# Patient Record
Sex: Male | Born: 1985 | Race: White | Hispanic: No | Marital: Married | Smoking: Never smoker
Health system: Southern US, Community
[De-identification: ages and names within clinical notes are randomized; demographics above are authoritative.]

## PROBLEM LIST (undated history)

## (undated) DIAGNOSIS — G4730 Sleep apnea, unspecified: Secondary | ICD-10-CM

## (undated) HISTORY — PX: HERNIA REPAIR: SHX51

## (undated) HISTORY — PX: WISDOM TOOTH EXTRACTION: SHX21

---

## 2010-07-02 ENCOUNTER — Emergency Department (HOSPITAL_COMMUNITY)
Admission: EM | Admit: 2010-07-02 | Discharge: 2010-07-03 | Payer: Self-pay | Source: Home / Self Care | Admitting: Emergency Medicine

## 2011-02-13 LAB — POCT I-STAT, CHEM 8
BUN: 13 mg/dL (ref 6–23)
Calcium, Ion: 0.96 mmol/L — ABNORMAL LOW (ref 1.12–1.32)
Creatinine, Ser: 1.6 mg/dL — ABNORMAL HIGH (ref 0.4–1.5)
HCT: 44 % (ref 39.0–52.0)
Sodium: 138 mEq/L (ref 135–145)
TCO2: 20 mmol/L (ref 0–100)

## 2011-02-13 LAB — BASIC METABOLIC PANEL
CO2: 27 mEq/L (ref 19–32)
Chloride: 107 mEq/L (ref 96–112)
GFR calc Af Amer: >60 mL/min (ref >60)
GFR calc non Af Amer: 58 mL/min — ABNORMAL LOW (ref >60)
Glucose, Bld: 102 mg/dL — ABNORMAL HIGH (ref 70–99)
Potassium: 4.2 mEq/L (ref 3.5–5.1)
Sodium: 140 mEq/L (ref 135–145)

## 2011-02-13 LAB — COMPREHENSIVE METABOLIC PANEL
ALT: 22 U/L (ref 0–53)
Albumin: 4.1 g/dL (ref 3.5–5.2)
Chloride: 107 mEq/L (ref 96–112)
Creatinine, Ser: 1.55 mg/dL — ABNORMAL HIGH (ref 0.4–1.5)
GFR calc non Af Amer: 56 mL/min — ABNORMAL LOW (ref >60)
Glucose, Bld: 100 mg/dL — ABNORMAL HIGH (ref 70–99)
Potassium: 3.7 mEq/L (ref 3.5–5.1)

## 2011-02-13 LAB — SAMPLE TO BLOOD BANK

## 2011-02-13 LAB — PROTIME-INR: INR: 1.16 (ref 0.00–1.49)

## 2011-02-13 LAB — CBC
MCH: 30.1 pg (ref 26.0–34.0)
MCHC: 34.5 g/dL (ref 30.0–36.0)
Platelets: 217 10*3/uL (ref 150–400)
WBC: 5.7 10*3/uL (ref 4.0–10.5)

## 2011-02-13 LAB — URINALYSIS, ROUTINE W REFLEX MICROSCOPIC: Nitrite: NEGATIVE

## 2011-02-13 LAB — APTT: aPTT: 24 seconds (ref 24–37)

## 2011-02-13 LAB — ETHANOL: Alcohol, Ethyl (B): 48 mg/dL — ABNORMAL HIGH (ref 0–10)
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
## 2019-09-22 ENCOUNTER — Other Ambulatory Visit: Payer: Self-pay

## 2019-09-22 DIAGNOSIS — Z20822 Contact with and (suspected) exposure to covid-19: Secondary | ICD-10-CM

## 2019-09-23 LAB — NOVEL CORONAVIRUS, NAA: SARS-CoV-2, NAA: NOT DETECTED

## 2019-11-21 ENCOUNTER — Other Ambulatory Visit: Payer: Self-pay

## 2020-01-25 ENCOUNTER — Encounter: Payer: Self-pay | Admitting: Otolaryngology

## 2020-01-30 ENCOUNTER — Other Ambulatory Visit: Payer: Self-pay

## 2020-01-30 ENCOUNTER — Other Ambulatory Visit
Admission: RE | Admit: 2020-01-30 | Discharge: 2020-01-30 | Disposition: A | Discharge status: Home / Self Care | Source: Ambulatory Visit | Attending: Otolaryngology | Admitting: Otolaryngology

## 2020-01-30 DIAGNOSIS — Z20822 Contact with and (suspected) exposure to covid-19: Secondary | ICD-10-CM | POA: Diagnosis not present

## 2020-01-30 DIAGNOSIS — Z01812 Encounter for preprocedural laboratory examination: Secondary | ICD-10-CM | POA: Insufficient documentation

## 2020-01-30 LAB — SARS CORONAVIRUS 2 (TAT 6-24 HRS): SARS Coronavirus 2: NEGATIVE

## 2020-02-01 ENCOUNTER — Ambulatory Visit: Admitting: Anesthesiology

## 2020-02-01 ENCOUNTER — Ambulatory Visit
Admission: RE | Admit: 2020-02-01 | Discharge: 2020-02-01 | Disposition: A | Discharge status: Home / Self Care | Attending: Otolaryngology | Admitting: Otolaryngology

## 2020-02-01 ENCOUNTER — Encounter: Payer: Self-pay | Admitting: Otolaryngology

## 2020-02-01 ENCOUNTER — Other Ambulatory Visit: Payer: Self-pay

## 2020-02-01 ENCOUNTER — Encounter
Admission: RE | Disposition: A | Discharge status: Home / Self Care | Payer: Self-pay | Source: Home / Self Care | Attending: Otolaryngology

## 2020-02-01 DIAGNOSIS — Z79899 Other long term (current) drug therapy: Secondary | ICD-10-CM | POA: Diagnosis not present

## 2020-02-01 DIAGNOSIS — G4733 Obstructive sleep apnea (adult) (pediatric): Secondary | ICD-10-CM | POA: Insufficient documentation

## 2020-02-01 DIAGNOSIS — J351 Hypertrophy of tonsils: Secondary | ICD-10-CM | POA: Diagnosis not present

## 2020-02-01 HISTORY — PX: TONSILLECTOMY: SHX5217

## 2020-02-01 HISTORY — DX: Sleep apnea, unspecified: G47.30

## 2020-02-01 SURGERY — TONSILLECTOMY
Anesthesia: General | Site: Throat | Laterality: Bilateral

## 2020-02-01 MED ORDER — FENTANYL CITRATE (PF) 100 MCG/2ML IJ SOLN
25.0000 ug | INTRAMUSCULAR | Status: DC | PRN
  Administered 2020-02-01 (×2): 25 ug via INTRAVENOUS

## 2020-02-01 MED ORDER — SUCCINYLCHOLINE CHLORIDE 20 MG/ML IJ SOLN
INTRAMUSCULAR | Status: DC | PRN
  Administered 2020-02-01: 100 mg via INTRAVENOUS

## 2020-02-01 MED ORDER — PROPOFOL 10 MG/ML IV BOLUS
INTRAVENOUS | Status: DC | PRN
  Administered 2020-02-01: 170 mg via INTRAVENOUS

## 2020-02-01 MED ORDER — ONDANSETRON HCL 4 MG/2ML IJ SOLN
INTRAMUSCULAR | Status: DC | PRN
  Administered 2020-02-01: 4 mg via INTRAVENOUS

## 2020-02-01 MED ORDER — BUPIVACAINE-EPINEPHRINE 0.25% -1:200000 IJ SOLN
INTRAMUSCULAR | Status: DC | PRN
  Administered 2020-02-01: 2 mL

## 2020-02-01 MED ORDER — SCOPOLAMINE 1 MG/3DAYS TD PT72
1.0000 | MEDICATED_PATCH | TRANSDERMAL | Status: DC
  Administered 2020-02-01: 1.5 mg via TRANSDERMAL

## 2020-02-01 MED ORDER — DEXAMETHASONE SODIUM PHOSPHATE 4 MG/ML IJ SOLN
INTRAMUSCULAR | Status: DC | PRN
  Administered 2020-02-01: 10 mg via INTRAVENOUS

## 2020-02-01 MED ORDER — GLYCOPYRROLATE 0.2 MG/ML IJ SOLN
INTRAMUSCULAR | Status: DC | PRN
  Administered 2020-02-01: .1 mg via INTRAVENOUS

## 2020-02-01 MED ORDER — MIDAZOLAM HCL 5 MG/5ML IJ SOLN
INTRAMUSCULAR | Status: DC | PRN
  Administered 2020-02-01: 2 mg via INTRAVENOUS

## 2020-02-01 MED ORDER — OXYMETAZOLINE HCL 0.05 % NA SOLN
NASAL | Status: DC | PRN
  Administered 2020-02-01: 1 via TOPICAL

## 2020-02-01 MED ORDER — FENTANYL CITRATE (PF) 100 MCG/2ML IJ SOLN
INTRAMUSCULAR | Status: DC | PRN
  Administered 2020-02-01: 100 ug via INTRAVENOUS
  Administered 2020-02-01 (×2): 25 ug via INTRAVENOUS

## 2020-02-01 MED ORDER — LIDOCAINE HCL (CARDIAC) PF 100 MG/5ML IV SOSY
PREFILLED_SYRINGE | INTRAVENOUS | Status: DC | PRN
  Administered 2020-02-01: 40 mg via INTRAVENOUS

## 2020-02-01 MED ORDER — PREDNISOLONE SODIUM PHOSPHATE 15 MG/5ML PO SOLN: ORAL | 0 refills | Status: AC

## 2020-02-01 MED ORDER — OXYCODONE HCL 5 MG PO TABS: 5.0000 mg | ORAL_TABLET | Freq: Once | ORAL | Status: AC | PRN

## 2020-02-01 MED ORDER — OXYCODONE HCL 5 MG/5ML PO SOLN
5.0000 mg | Freq: Once | ORAL | Status: AC | PRN
  Administered 2020-02-01: 5 mg via ORAL

## 2020-02-01 MED ORDER — HYDROCODONE-ACETAMINOPHEN 7.5-325 MG/15ML PO SOLN: ORAL | 0 refills | Status: AC

## 2020-02-01 MED ORDER — ACETAMINOPHEN 10 MG/ML IV SOLN
1000.0000 mg | Freq: Once | INTRAVENOUS | Status: AC
  Administered 2020-02-01: 1000 mg via INTRAVENOUS

## 2020-02-01 MED ORDER — LACTATED RINGERS IV SOLN: 100.0000 mL/h | INTRAVENOUS | Status: DC

## 2020-02-01 SURGICAL SUPPLY — 15 items
BLADE BOVIE TIP EXT 4 (BLADE) ×3 IMPLANT
CANISTER SUCT 1200ML W/VALVE (MISCELLANEOUS) ×3 IMPLANT
CATH ROBINSON RED A/P 10FR (CATHETERS) ×3 IMPLANT
COAG SUCT 10F 3.5MM HAND CTRL (MISCELLANEOUS) ×3 IMPLANT
DECANTER SPIKE VIAL GLASS SM (MISCELLANEOUS) ×3 IMPLANT
ELECT REM PT RETURN 9FT ADLT (ELECTROSURGICAL) ×3
ELECTRODE REM PT RTRN 9FT ADLT (ELECTROSURGICAL) ×1 IMPLANT
GLOVE BIO SURGEON STRL SZ7.5 (GLOVE) ×3 IMPLANT
KIT TURNOVER KIT A (KITS) ×3 IMPLANT
NS IRRIG 500ML POUR BTL (IV SOLUTION) ×3 IMPLANT
PACK TONSIL AND ADENOID CUSTOM (PACKS) ×3 IMPLANT
PENCIL SMOKE EVACUATOR (MISCELLANEOUS) ×3 IMPLANT
SLEEVE SUCTION 125 (MISCELLANEOUS) ×3 IMPLANT
SOL ANTI-FOG 6CC FOG-OUT (MISCELLANEOUS) ×1 IMPLANT
SOL FOG-OUT ANTI-FOG 6CC (MISCELLANEOUS) ×2

## 2020-02-01 NOTE — Anesthesia Procedure Notes (Signed)
Procedure Name: Intubation Date/Time: 02/01/2020 10:33 AM Performed by: Jimmy Picket, CRNA Pre-anesthesia Checklist: Patient identified, Emergency Drugs available, Suction available, Patient being monitored and Timeout performed Patient Re-evaluated:Patient Re-evaluated prior to induction Oxygen Delivery Method: Circle system utilized Preoxygenation: Pre-oxygenation with 100% oxygen Induction Type: IV induction Ventilation: Mask ventilation without difficulty Laryngoscope Size: Miller and 3 Grade View: Grade I Tube type: Oral Rae Tube size: 7.5 mm Number of attempts: 1 Placement Confirmation: ETT inserted through vocal cords under direct vision,  positive ETCO2 and breath sounds checked- equal and bilateral Tube secured with: Tape Dental Injury: Teeth and Oropharynx as per pre-operative assessment

## 2020-02-01 NOTE — H&P (Signed)
Juan Gomez, Juan Gomez [id number] [birth year]  Date of Admission: @TODAY @ Admitting Physician:  Chief Complaint: sleep aonea, large tonsils  HPI: This 34 y.o. year old male with a history of severe OSA documented on home sleep study. He has not been on CPAP but was noted to have large tonsils by his PCP. My exam confirmed 3+ tonsils partially obstructing the airway, though also noted to have tongue base prominence.  Medications:  Medications Prior to Admission  Medication Sig Dispense Refill  . Ascorbic Acid (VITAMIN C PO) Take by mouth daily.    . fluticasone (FLONASE) 50 MCG/ACT nasal spray Place into both nostrils daily.      Allergies: No Known Allergies  PMH:  Past Medical History:  Diagnosis Date  . Sleep apnea    no CPAP    Fam Hx: History reviewed. No pertinent family history.  Soc Hx:  Social History   Socioeconomic History  . Marital status: Married    Spouse name: Not on file  . Number of children: Not on file  . Years of education: Not on file  . Highest education level: Not on file  Occupational History  . Not on file  Tobacco Use  . Smoking status: Never Smoker  . Smokeless tobacco: Never Used  Substance and Sexual Activity  . Alcohol use: Yes    Alcohol/week: 5.0 standard drinks    Types: 5 Cans of beer per week  . Drug use: Not on file  . Sexual activity: Not on file  Other Topics Concern  . Not on file  Social History Narrative  . Not on file   Social Determinants of Health   Financial Resource Strain:   . Difficulty of Paying Living Expenses: Not on file  Food Insecurity:   . Worried About 34 in the Last Year: Not on file  . Ran Out of Food in the Last Year: Not on file  Transportation Needs:   . Lack of Transportation (Medical): Not on file  . Lack of Transportation (Non-Medical): Not on file  Physical Activity:   . Days of Exercise per Week: Not on file  . Minutes of Exercise per Session: Not on file  Stress:   .  Feeling of Stress : Not on file  Social Connections:   . Frequency of Communication with Friends and Family: Not on file  . Frequency of Social Gatherings with Friends and Family: Not on file  . Attends Religious Services: Not on file  . Active Member of Clubs or Organizations: Not on file  . Attends Programme researcher, broadcasting/film/video Meetings: Not on file  . Marital Status: Not on file  Intimate Partner Violence:   . Fear of Current or Ex-Partner: Not on file  . Emotionally Abused: Not on file  . Physically Abused: Not on file  . Sexually Abused: Not on file    PSH:  Past Surgical History:  Procedure Laterality Date  . HERNIA REPAIR     as child  . WISDOM TOOTH EXTRACTION    .   ROS: Negative for fever, cough  PHYSICAL EXAM  Vitals: Blood pressure 123/90, pulse 75, temperature 97.7 F (36.5 C), temperature source Temporal, height 6' (1.829 m), weight 95.3 kg, SpO2 99 %.. General: Well-developed, Well-nourished in no acute distress Mood: Mood and affect well adjusted, pleasant and cooperative. Orientation: Grossly alert and oriented. Vocal Quality: No hoarseness. Communicates verbally. head and Face: NCAT. No facial asymmetry. No visible skin lesions. No significant facial scars.  No tenderness with sinus percussion. Facial strength normal and symmetric. Oral Cavity/ Oropharynx: (from prior clinic exam) Lips are normal with no lesions. Teeth no frank dental caries. Gingiva healthy with no lesions or gingivitis. Oropharynx including tongue, buccal mucosa, floor of mouth, hard and soft palate, uvula and posterior pharynx free of exudates, erythema or lesions with normal symmetry and hydration. Tonsils 3+.  Indirect Laryngoscopy/Nasopharyngoscopy: Visualization of the larynx, hypopharynx and nasopharynx is not possible in this setting with routine examination. Respiratory: Normal respiratory effort without labored breathing. Lungs CTA bilaterally Cardiovascular: Carotid pulse shows regular rate and  rhythm. Heart RRR  MEDICAL DECISION MAKING: Data Review:  Results for orders placed or performed during the hospital encounter of 01/30/20 (from the past 48 hour(s))  SARS CORONAVIRUS 2 (TAT 6-24 HRS) Nasopharyngeal Nasopharyngeal Swab     Status: None   Collection Time: 01/30/20 11:31 AM   Specimen: Nasopharyngeal Swab  Result Value Ref Range   SARS Coronavirus 2 NEGATIVE NEGATIVE    Comment: (NOTE) SARS-CoV-2 target nucleic acids are NOT DETECTED. The SARS-CoV-2 RNA is generally detectable in upper and lower respiratory specimens during the acute phase of infection. Negative results do not preclude SARS-CoV-2 infection, do not rule out co-infections with other pathogens, and should not be used as the sole basis for treatment or other patient management decisions. Negative results must be combined with clinical observations, patient history, and epidemiological information. The expected result is Negative. Fact Sheet for Patients: SugarRoll.be Fact Sheet for Healthcare Providers: https://www.woods-mathews.com/ This test is not yet approved or cleared by the [place] FDA and  has been authorized for detection and/or diagnosis of SARS-CoV-2 by FDA under an Emergency Use Authorization (EUA). This EUA will remain  in effect (meaning this test can be used) for the duration of the COVID-19 declaration under Section 56 4(b)(1) of the Act, 21 U.S.C. section 360bbb-3(b)(1), unless the authorization is terminated or revoked sooner. Performed at [facility], [place] [address] [place], [place] [address]   . No results found..   ASSESSMENT: OSA with tonsillar hyperplasia   PLAN: We discussed standard of OSA management is CPAP and weight loss, and that with severe OSA I cannot predict how much his tonsils contribute, though certainly likely play a role. He was noted to have tongue base prominence and this also would contribute. While we can  consider tonsillectomy and get this approved I suspect, more aggressive surgical approaches for OSA (UPPP, tongue base procedures) require a trial of CPAP management in order to get surgical approval, thus would not be an up front option for management. After consideration he has elected to proceed with tonsillectomy. He should have a follow-up sleep study 3 months post-op to reassess his OSA, and be placed on CPAP if OSA is still noted.    Riley Nearing 02/01/2020 10:27 AM

## 2020-02-01 NOTE — Anesthesia Preprocedure Evaluation (Addendum)
Anesthesia Evaluation  Patient identified by MRN, date of birth, ID band Patient awake    Reviewed: Allergy & Precautions, H&P , NPO status   Airway Mallampati: I  TM Distance: >3 FB Neck ROM: full   Comment: Large kissing tonsils Dental no notable dental hx.    Pulmonary sleep apnea ,    Pulmonary exam normal        Cardiovascular negative cardio ROS Normal cardiovascular exam Rhythm:regular Rate:Normal     Neuro/Psych negative neurological ROS     GI/Hepatic negative GI ROS, Neg liver ROS,   Endo/Other  negative endocrine ROS  Renal/GU negative Renal ROS  negative genitourinary   Musculoskeletal   Abdominal   Peds  Hematology negative hematology ROS (+)   Anesthesia Other Findings   Reproductive/Obstetrics                            Anesthesia Physical Anesthesia Plan  ASA: II  Anesthesia Plan: General ETT   Post-op Pain Management:    Induction:   PONV Risk Score and Plan:   Airway Management Planned:   Additional Equipment:   Intra-op Plan:   Post-operative Plan:   Informed Consent: I have reviewed the patients History and Physical, chart, labs and discussed the procedure including the risks, benefits and alternatives for the proposed anesthesia with the patient or authorized representative who has indicated his/her understanding and acceptance.       Plan Discussed with:   Anesthesia Plan Comments:         Anesthesia Quick Evaluation

## 2020-02-01 NOTE — Transfer of Care (Signed)
Immediate Anesthesia Transfer of Care Note  Patient: Juan Gomez  Procedure(s) Performed: TONSILLECTOMY (Bilateral Throat)  Patient Location: PACU  Anesthesia Type: General ETT  Level of Consciousness: awake, alert  and patient cooperative  Airway and Oxygen Therapy: Patient Spontanous Breathing and Patient connected to supplemental oxygen  Post-op Assessment: Post-op Vital signs reviewed, Patient's Cardiovascular Status Stable, Respiratory Function Stable, Patent Airway and No signs of Nausea or vomiting  Post-op Vital Signs: Reviewed and stable  Complications: No apparent anesthesia complications

## 2020-02-01 NOTE — H&P (Signed)
History and physical reviewed and will be scanned in later. No change in medical status reported by the patient or family, appears stable for surgery. All questions regarding the procedure answered, and patient (or family if a child) expressed understanding of the procedure. ? ?Juan Gomez [name] ?@TODAY@ ?

## 2020-02-01 NOTE — Discharge Instructions (Signed)
T & A INSTRUCTION SHEET - [facility]  P. SCOTT BENNETT, MD  [address] [place],  [address] [place]. [phone] [address] [place] [address] [phone]  INFORMATION SHEET FOR A TONSILLECTOMY AND ADENDOIDECTOMY  About Your Tonsils and Adenoids The tonsils and adenoids are normal body tissues that are part of our immune system. They normally help to protect us against diseases that may enter our mouth and nose. However, sometimes the tonsils and/or adenoids become too large and obstruct our breathing, especially at night.  If either of these things happen it helps to remove the tonsils and adenoids in order to become healthier. The operation to remove the tonsils and adenoids is called a tonsillectomy and adenoidectomy.  The Location of Your Tonsils and Adenoids The tonsils are located in the back of the throat on both side and sit in a cradle of muscles. The adenoids are located in the roof of the mouth, behind the nose, and closely associated with the opening of the Eustachian tube to the ear.  Surgery on Tonsils and Adenoids A tonsillectomy and adenoidectomy is a short operation which takes about thirty minutes. This includes being put to sleep and being awakened. Tonsillectomies and adenoidectomies are performed at [facility] and may require observation period in the recovery room prior to going home. Children are required to remain in the recovery area for 45 minutes after surgery.  Following the Operation for a Tonsillectomy A cautery machine is used to control bleeding.  Bleeding from a tonsillectomy and adenoidectomy is minimal and postoperatively the risk of bleeding is approximately four percent, although this rarely life threatening.  After your tonsillectomy and adenoidectomy post-op care at home: 1. Our patients are able to go home the same day. You may be given prescriptions for  pain medications and antibiotics, if indicated. 2. It is extremely important to remember that fluid intake is of utmost importance after a tonsillectomy. The amount that you drink must be maintained in the postoperative period. A good indication of whether a child is getting enough fluid is whether his/her urine output is constant.  As long as children are urinating or wetting their diaper every 6 - 8 hours this is usually enough fluid intake.   3. Although rare, this is a risk of some bleeding in the first ten days after surgery. This usually occurs between day five and nine postoperatively. This risk of bleeding is approximately four percent.  If you or your child should have any bleeding you should remain calm and notify our office or go directly to the Emergency Room at [facility] where they will contact us. Our doctors are available seven days a week for notification. We recommend sitting up quietly in a chair, place an ice pack on the front of the neck and spitting out the blood gently until we are able to contact you. Adults should gargle gently with ice water and this may help stop the bleeding. If the bleeding does not stop after a short time, i.e. 10 to 15 minutes, or seems to be increasing again, please contact us or go to the hospital.   4. It is common for the pain to be worse at 5 - 7 days postoperatively. This occurs because the "scab" is peeling off and the mucous membrane (skin of the throat) is growing back where the tonsils were.   5. It is common for a low-grade fever,   less than 102, during the first week after a tonsillectomy and adenoidectomy. It is usually due to not drinking enough liquids, and we suggest your use liquid Tylenol (acetaminophen) or the pain medicine with Tylenol (acetaminophen) prescribed in order to keep your temperature below 102. Please follow the directions on the back of the bottle. 6. Do not take aspirin or any products that contain aspirin  such as Bufferin, Anacin, Ecotrin, aspirin gum, Goodies, BC headache powders, etc., after a T&A because it can promote bleeding.  DO NOT TAKE MOTRIN OR IBUPROFEN. Please check with our office before administering any other medication that may been prescribed by other doctors during the two-week post-operative period. 7. If you happen to look in the mirror or into your child's mouth you will see white/gray patches on the back of the throat.  This is what a scab looks like in the mouth and is normal after having a tonsillectomy and adenoidectomy. It will disappear once the tonsil area heals completely. However, it may cause a noticeable odor, and this too will disappear with time.     8. You or your child may experience ear pain after having a tonsillectomy and adenoidectomy. This is called referred pain and comes from the throat, but it is felt in the ears. Ear pain is quite common and expected. It will usually go away after ten days. There is usually nothing wrong with the ears, and it is primarily due to the healing area stimulating the nerve to the ear that runs along the side of the throat. Use either the prescribed pain medicine or Tylenol (acetaminophen) as needed.  9. The throat tissues after a tonsillectomy are obviously sensitive. Smoking around children who have had a tonsillectomy significantly increases the risk of bleeding.  DO NOT SMOKE! What to Expect Each Day  First Day at Home 1. Patients will be discharged home the same day.  2. Drink at least four glasses of liquid a day. Clear, cool liquids are recommended. Fruit juices containing citric acid are not recommended because they tend to cause pain. Carbonated beverages are allowed if you pour them from glass to glass to remove the bubbles as these tend to cause discomfort. Avoid alcoholic beverages.  3. Eat very soft foods such as soups, broth, jello, custard, pudding, ice cream, popsicles, applesauce, mashed potatoes, and in general anything  that you can crush between your tongue and the roof of your mouth. Try adding Carnation Instant Breakfast Mix into your food for extra calories. It is not uncommon to lose 5 to 10 pounds of fluid weight. The weight will be gained back quickly once you're feeling better and drinking more.  4. Sleep with your head elevated on two pillows for about three days to help decrease the swelling.  5. DO NOT SMOKE!  Day Two  1. Rest as much as possible. Use common sense in your activities.  2. Continue drinking at least four glasses of liquid per day.  3. Follow the soft diet.  4. Use your pain medication as needed.  Day Three  1. Advance your activity as you are able and continue to follow the previous day's suggestions.  Days Four Through Six  1. Advance your diet and begin to eat more solid foods such as chopped hamburger. 2. Advance your activities slowly. Children should be kept mostly around the house.  3. Not uncommonly, there will be more pain at this time. It is temporary, usually lasting a day or two.  Day Seven   Through Ten  1. Most individuals by this time are able to return to work or school unless otherwise instructed. Consider sending children back to school for a half day on the first day back.   General Anesthesia, Adult, Care After This sheet gives you information about how to care for yourself after your procedure. Your health care provider may also give you more specific instructions. If you have problems or questions, contact your health care provider. What can I expect after the procedure? After the procedure, the following side effects are common:  Pain or discomfort at the IV site.  Nausea.  Vomiting.  Sore throat.  Trouble concentrating.  Feeling cold or chills.  Weak or tired.  Sleepiness and fatigue.  Soreness and body aches. These side effects can affect parts of the body that were not involved in surgery. Follow these instructions at home:  For at least 24  hours after the procedure:  Have a responsible adult stay with you. It is important to have someone help care for you until you are awake and alert.  Rest as needed.  Do not: ? Participate in activities in which you could fall or become injured. ? Drive. ? Use heavy machinery. ? Drink alcohol. ? Take sleeping pills or medicines that cause drowsiness. ? Make important decisions or sign legal documents. ? Take care of children on your own. Eating and drinking  Follow any instructions from your health care provider about eating or drinking restrictions.  When you feel hungry, start by eating small amounts of foods that are soft and easy to digest (bland), such as toast. Gradually return to your regular diet.  Drink enough fluid to keep your urine pale yellow.  If you vomit, rehydrate by drinking water, juice, or clear broth. General instructions  If you have sleep apnea, surgery and certain medicines can increase your risk for breathing problems. Follow instructions from your health care provider about wearing your sleep device: ? Anytime you are sleeping, including during daytime naps. ? While taking prescription pain medicines, sleeping medicines, or medicines that make you drowsy.  Return to your normal activities as told by your health care provider. Ask your health care provider what activities are safe for you.  Take over-the-counter and prescription medicines only as told by your health care provider.  If you smoke, do not smoke without supervision.  Keep all follow-up visits as told by your health care provider. This is important. Contact a health care provider if:  You have nausea or vomiting that does not get better with medicine.  You cannot eat or drink without vomiting.  You have pain that does not get better with medicine.  You are unable to pass urine.  You develop a skin rash.  You have a fever.  You have redness around your IV site that gets worse. Get  help right away if:  You have difficulty breathing.  You have chest pain.  You have blood in your urine or stool, or you vomit blood. Summary  After the procedure, it is common to have a sore throat or nausea. It is also common to feel tired.  Have a responsible adult stay with you for the first 24 hours after general anesthesia. It is important to have someone help care for you until you are awake and alert.  When you feel hungry, start by eating small amounts of foods that are soft and easy to digest (bland), such as toast. Gradually return to your regular diet.    Drink enough fluid to keep your urine pale yellow.  Return to your normal activities as told by your health care provider. Ask your health care provider what activities are safe for you. This information is not intended to replace advice given to you by your health care provider. Make sure you discuss any questions you have with your health care provider. Document Revised: 11/19/2017 Document Reviewed: 07/02/2017 Elsevier Patient Education  [employer].  Scopolamine skin patches What is this medicine? SCOPOLAMINE (skoe POL a meen) is used to prevent nausea and vomiting caused by motion sickness, anesthesia and surgery. This medicine may be used for other purposes; ask your health care provider or pharmacist if you have questions. COMMON BRAND NAME(S): Transderm Scop What should I tell my health care provider before I take this medicine? They need to know if you have any of these conditions:  are scheduled to have a gastric secretion test  glaucoma  heart disease  kidney disease  liver disease  lung or breathing disease, like asthma  mental illness  prostate disease  seizures  stomach or intestine problems  trouble passing urine  an unusual or allergic reaction to scopolamine, atropine, other medicines, foods, dyes, or preservatives  pregnant or trying to get pregnant  breast-feeding How  should I use this medicine? This medicine is for external use only. Follow the directions on the prescription label. Wear only 1 patch at a time. Choose an area behind the ear, that is clean, dry, hairless and free from any cuts or irritation. Wipe the area with a clean dry tissue. Peel off the plastic backing of the skin patch, trying not to touch the adhesive side with your hands. Do not cut the patches. Firmly apply to the area you have chosen, with the metallic side of the patch to the skin and the tan-colored side showing. Once firmly in place, wash your hands well with soap and water. Do not get this medicine into your eyes. After removing the patch, wash your hands and the area behind your ear thoroughly with soap and water. The patch will still contain some medicine after use. To avoid accidental contact or ingestion by children or pets, fold the used patch in half with the sticky side together and throw away in the trash out of the reach of children and pets. If you need to use a second patch after you remove the first, place it behind the other ear. A special MedGuide will be given to you by the pharmacist with each prescription and refill. Be sure to read this information carefully each time. Talk to your pediatrician regarding the use of this medicine in children. Special care may be needed. Overdosage: If you think you have taken too much of this medicine contact a poison control center or emergency room at once. NOTE: This medicine is only for you. Do not share this medicine with others. What if I miss a dose? This does not apply. This medicine is not for regular use. What may interact with this medicine?  alcohol  antihistamines for allergy cough and cold  atropine  certain medicines for anxiety or sleep  certain medicines for bladder problems like oxybutynin, tolterodine  certain medicines for depression like amitriptyline, fluoxetine, sertraline  certain medicines for stomach  problems like dicyclomine, hyoscyamine  certain medicines for Parkinson's disease like benztropine, trihexyphenidyl  certain medicines for seizures like phenobarbital, primidone  general anesthetics like halothane, isoflurane, methoxyflurane, propofol  ipratropium  local anesthetics like lidocaine, pramoxine,  tetracaine  medicines that relax muscles for surgery  phenothiazines like chlorpromazine, mesoridazine, prochlorperazine, thioridazine  narcotic medicines for pain  other belladonna alkaloids This list may not describe all possible interactions. Give your health care provider a list of all the medicines, herbs, non-prescription drugs, or dietary supplements you use. Also tell them if you smoke, drink alcohol, or use illegal drugs. Some items may interact with your medicine. What should I watch for while using this medicine? Limit contact with water while swimming and bathing because the patch may fall off. If the patch falls off, throw it away and put a new one behind the other ear. You may get drowsy or dizzy. Do not drive, use machinery, or do anything that needs mental alertness until you know how this medicine affects you. Do not stand or sit up quickly, especially if you are an older patient. This reduces the risk of dizzy or fainting spells. Alcohol may interfere with the effect of this medicine. Avoid alcoholic drinks. Your mouth may get dry. Chewing sugarless gum or sucking hard candy, and drinking plenty of water may help. Contact your healthcare professional if the problem does not go away or is severe. This medicine may cause dry eyes and blurred vision. If you wear contact lenses, you may feel some discomfort. Lubricating drops may help. See your healthcare professional if the problem does not go away or is severe. If you are going to need surgery, an MRI, CT scan, or other procedure, tell your healthcare professional that you are using this medicine. You may need to remove  the patch before the procedure. What side effects may I notice from receiving this medicine? Side effects that you should report to your doctor or health care professional as soon as possible:  allergic reactions like skin rash, itching or hives; swelling of the face, lips, or tongue  blurred vision  changes in vision  confusion  dizziness  eye pain  fast, irregular heartbeat  hallucinations, loss of contact with reality  nausea, vomiting  pain or trouble passing urine  restlessness  seizures  skin irritation  stomach pain Side effects that usually do not require medical attention (report to your doctor or health care professional if they continue or are bothersome):  drowsiness  dry mouth  headache  sore throat This list may not describe all possible side effects. Call your doctor for medical advice about side effects. You may report side effects to FDA at 1-800-FDA-1088. Where should I keep my medicine? Keep out of the reach of children. Store at room temperature between 20 and 25 degrees C (68 and 77 degrees F). Keep this medicine in the foil package until ready to use. Throw away any unused medicine after the expiration date. NOTE: This sheet is a summary. It may not cover all possible information. If you have questions about this medicine, talk to your doctor, pharmacist, or health care provider.  2020 Elsevier/Gold Standard (2018-02-04 16:14:46)

## 2020-02-01 NOTE — Anesthesia Postprocedure Evaluation (Signed)
Anesthesia Post Note  Patient: Juan Gomez  Procedure(s) Performed: TONSILLECTOMY (Bilateral Throat)     Patient location during evaluation: PACU Anesthesia Type: General Level of consciousness: awake and alert Pain management: pain level controlled Vital Signs Assessment: post-procedure vital signs reviewed and stable Respiratory status: spontaneous breathing Cardiovascular status: stable Anesthetic complications: no    Andilyn Bettcher, III,  Moataz Tavis D

## 2020-02-01 NOTE — Op Note (Signed)
02/01/2020  11:29 AM    Juan Gomez  [id number]   Pre-Op Diagnosis:  tonsil hypertrophy, OSA  Post-op Diagnosis: tonsil hypertrophy, OSA  Procedure: Tonsillectomy  Surgeon:  Sandi Mealy., MD  Anesthesia:  General endotracheal  EBL:  50 cc  Complications:  None  Findings: 3-4+ tonsils obstructing the pharynx  Procedure: The patient was taken to the Operating Room and placed in the supine position.  After induction of general endotracheal anesthesia, the table was turned 90 degrees and the patient was draped in the usual fashion  with the eyes protected.  A mouth gag was inserted into the oral cavity to open the mouth, and examination of the oropharynx showed the uvula was non-bifid. The palate was palpated, and there was no evidence of submucous cleft. Examination of the nasopharynx showed no obstructing adenoids. The right tonsil was grasped with an Allis clamp and resected from the tonsillar fossa in the usual fashion with the Bovie. The left tonsil was resected in the same fashion. The Bovie was used to obtain hemostasis. Each tonsillar fossa was then carefully injected with 0.25% marcaine avoiding intravascular injection. The nose and throat were irrigated and suctioned to remove any  blood clot. The mouth gag was  removed with no evidence of active bleeding.  The patient was then returned to the anesthesiologist for awakening, and was taken to the Recovery Room in stable condition.  Cultures:  None.  Specimens:  Tonsils.  Disposition:   PACU to home  Plan: Soft, bland diet and push fluids. Take pain medications and steroids as prescribed. No strenuous activity for 2 weeks. Follow-up in 3 weeks.  Sandi Mealy 02/01/2020 11:29 AM

## 2020-02-02 ENCOUNTER — Encounter: Payer: Self-pay | Admitting: *Deleted

## 2020-02-05 LAB — SURGICAL PATHOLOGY
